# Patient Record
Sex: Female | Born: 1975 | Race: White | Hispanic: No | Marital: Married | Smoking: Current every day smoker
Health system: Southern US, Community
[De-identification: ages and names within clinical notes are randomized; demographics above are authoritative.]

## PROBLEM LIST (undated history)

## (undated) DIAGNOSIS — R51 Headache: Secondary | ICD-10-CM

## (undated) DIAGNOSIS — R519 Headache, unspecified: Secondary | ICD-10-CM

## (undated) DIAGNOSIS — S52121A Displaced fracture of head of right radius, initial encounter for closed fracture: Secondary | ICD-10-CM

## (undated) DIAGNOSIS — K219 Gastro-esophageal reflux disease without esophagitis: Secondary | ICD-10-CM

## (undated) HISTORY — PX: CHOLECYSTECTOMY: SHX55

## (undated) HISTORY — PX: TUBAL LIGATION: SHX77

---

## 2016-05-02 ENCOUNTER — Other Ambulatory Visit: Payer: Self-pay | Admitting: Orthopedic Surgery

## 2016-05-02 ENCOUNTER — Encounter (HOSPITAL_BASED_OUTPATIENT_CLINIC_OR_DEPARTMENT_OTHER): Payer: Self-pay | Admitting: *Deleted

## 2016-05-02 NOTE — H&P (Signed)
Kari Jenkins is an 41 y.o. female.   CC / Reason for Visit: Right elbow problem HPI: This patient is a 41 year old, right-hand-dominant, female who tripped and fell while at work and fractured her right radial head.  She was seen in the emergency department where she was placed into a posterior splint and sling.  They did not bring any x-rays with him today.  The patient reports that she has been taking some hydrocodone for pain but nothing else.  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  She arrives at this appointment with her nurse case manager, Debby Bud.  Past Medical History:  Diagnosis Date  . Fracture of radial head, right, closed   . GERD (gastroesophageal reflux disease)   . Headache     Past Surgical History:  Procedure Laterality Date  . CESAREAN SECTION    . CHOLECYSTECTOMY    . TUBAL LIGATION      History reviewed. No pertinent family history. Social History:  reports that she has been smoking.  She has been smoking about 0.50 packs per day. She has never used smokeless tobacco. She reports that she does not drink alcohol or use drugs.  Allergies: No Known Allergies  No prescriptions prior to admission.    No results found for this or any previous visit (from the past 48 hour(s)). No results found.  Review of Systems  All other systems reviewed and are negative.   Height  (1.626 m), weight 90.7 kg (200 lb), last menstrual period 04/12/2016. Physical Exam  Constitutional:  WD, WN, NAD HEENT:  NCAT, EOMI Neuro/Psych:  Alert & oriented to person, place, and time; appropriate mood & affect Lymphatic: No generalized UE edema or lymphadenopathy Extremities / MSK:  Both UE are normal with respect to appearance, ranges of motion, joint stability, muscle strength/tone, sensation, & perfusion except as otherwise noted:  The right elbow is swollen.  There is ecchymosis into the wrist on the volar  aspect.  The patient has full digital range of motion and is NVI.  At this time the patient is capable of extending her elbow to approximately 35 and flexing to 95.  She does have full supination and pronation.  Labs / Xrays:  2 views of the right elbow ordered and obtained today demonstrate a right, closed, intra-articular, radial head fracture with 3.8 mm of displacement of a portion of the head, roughly 30%  Assessment: Right elbow radial head fracture  Plan:  The findings are discussed with the nurse case manager as well as the patient.  She is to remain in a sling and utilize Tylenol for pain.  She is scheduled for ORIF of her right radial head for Monday 05/07/2016. The details of the operative procedure were discussed with the patient.  Questions were invited and answered.  In addition to the goal of the procedure, the risks of the procedure to include but not limited to bleeding; infection; damage to the nerves or blood vessels that could result in bleeding, numbness, weakness, chronic pain, and the need for additional procedures; stiffness; the need for revision surgery; and anesthetic risks were reviewed.  No specific outcome was guaranteed or implied.  Informed consent was obtained.   Aniyia Rane A., MD 05/02/2016, 5:37 PM

## 2016-05-07 ENCOUNTER — Encounter (HOSPITAL_BASED_OUTPATIENT_CLINIC_OR_DEPARTMENT_OTHER)
Admission: RE | Disposition: A | Discharge status: Home / Self Care | Payer: Self-pay | Source: Ambulatory Visit | Attending: Orthopedic Surgery

## 2016-05-07 ENCOUNTER — Ambulatory Visit (HOSPITAL_COMMUNITY): Payer: Worker's Compensation

## 2016-05-07 ENCOUNTER — Encounter (HOSPITAL_BASED_OUTPATIENT_CLINIC_OR_DEPARTMENT_OTHER): Payer: Self-pay | Admitting: *Deleted

## 2016-05-07 ENCOUNTER — Ambulatory Visit (HOSPITAL_BASED_OUTPATIENT_CLINIC_OR_DEPARTMENT_OTHER): Payer: Worker's Compensation | Admitting: Anesthesiology

## 2016-05-07 ENCOUNTER — Ambulatory Visit (HOSPITAL_BASED_OUTPATIENT_CLINIC_OR_DEPARTMENT_OTHER)
Admission: RE | Admit: 2016-05-07 | Discharge: 2016-05-07 | Disposition: A | Discharge status: Home / Self Care | Payer: Worker's Compensation | Source: Ambulatory Visit | Attending: Orthopedic Surgery | Admitting: Orthopedic Surgery

## 2016-05-07 DIAGNOSIS — Z419 Encounter for procedure for purposes other than remedying health state, unspecified: Secondary | ICD-10-CM

## 2016-05-07 DIAGNOSIS — Y990 Civilian activity done for income or pay: Secondary | ICD-10-CM | POA: Diagnosis not present

## 2016-05-07 DIAGNOSIS — F17210 Nicotine dependence, cigarettes, uncomplicated: Secondary | ICD-10-CM | POA: Diagnosis not present

## 2016-05-07 DIAGNOSIS — W010XXA Fall on same level from slipping, tripping and stumbling without subsequent striking against object, initial encounter: Secondary | ICD-10-CM | POA: Insufficient documentation

## 2016-05-07 DIAGNOSIS — S52121A Displaced fracture of head of right radius, initial encounter for closed fracture: Secondary | ICD-10-CM | POA: Diagnosis present

## 2016-05-07 HISTORY — DX: Headache, unspecified: R51.9

## 2016-05-07 HISTORY — DX: Headache: R51

## 2016-05-07 HISTORY — DX: Displaced fracture of head of right radius, initial encounter for closed fracture: S52.121A

## 2016-05-07 HISTORY — DX: Gastro-esophageal reflux disease without esophagitis: K21.9

## 2016-05-07 HISTORY — PX: ORIF RADIAL FRACTURE: SHX5113

## 2016-05-07 SURGERY — OPEN REDUCTION INTERNAL FIXATION (ORIF) RADIAL FRACTURE
Anesthesia: General | Site: Elbow | Laterality: Right

## 2016-05-07 MED ORDER — MIDAZOLAM HCL 2 MG/2ML IJ SOLN
INTRAMUSCULAR | Status: AC
  Filled 2016-05-07: qty 2

## 2016-05-07 MED ORDER — PROPOFOL 10 MG/ML IV BOLUS
INTRAVENOUS | Status: DC | PRN
  Administered 2016-05-07: 200 mg via INTRAVENOUS

## 2016-05-07 MED ORDER — PHENYLEPHRINE 40 MCG/ML (10ML) SYRINGE FOR IV PUSH (FOR BLOOD PRESSURE SUPPORT)
PREFILLED_SYRINGE | INTRAVENOUS | Status: AC
  Filled 2016-05-07: qty 10

## 2016-05-07 MED ORDER — FENTANYL CITRATE (PF) 100 MCG/2ML IJ SOLN: 25.0000 ug | INTRAMUSCULAR | Status: DC | PRN

## 2016-05-07 MED ORDER — EPHEDRINE 5 MG/ML INJ
INTRAVENOUS | Status: AC
  Filled 2016-05-07: qty 10

## 2016-05-07 MED ORDER — DEXAMETHASONE SODIUM PHOSPHATE 10 MG/ML IJ SOLN
INTRAMUSCULAR | Status: AC
  Filled 2016-05-07: qty 1

## 2016-05-07 MED ORDER — ACETAMINOPHEN 325 MG PO TABS: 650.0000 mg | ORAL_TABLET | Freq: Four times a day (QID) | ORAL | Status: AC | PRN

## 2016-05-07 MED ORDER — ONDANSETRON HCL 4 MG/2ML IJ SOLN
INTRAMUSCULAR | Status: DC | PRN
  Administered 2016-05-07: 4 mg via INTRAVENOUS

## 2016-05-07 MED ORDER — FENTANYL CITRATE (PF) 100 MCG/2ML IJ SOLN
INTRAMUSCULAR | Status: AC
  Filled 2016-05-07: qty 2

## 2016-05-07 MED ORDER — METOCLOPRAMIDE HCL 5 MG/ML IJ SOLN: 10.0000 mg | Freq: Once | INTRAMUSCULAR | Status: DC | PRN

## 2016-05-07 MED ORDER — MEPERIDINE HCL 25 MG/ML IJ SOLN: 6.2500 mg | INTRAMUSCULAR | Status: DC | PRN

## 2016-05-07 MED ORDER — ARTIFICIAL TEARS OP OINT
TOPICAL_OINTMENT | OPHTHALMIC | Status: AC
  Filled 2016-05-07: qty 3.5

## 2016-05-07 MED ORDER — ONDANSETRON HCL 4 MG/2ML IJ SOLN
INTRAMUSCULAR | Status: AC
  Filled 2016-05-07: qty 2

## 2016-05-07 MED ORDER — LIDOCAINE 2% (20 MG/ML) 5 ML SYRINGE
INTRAMUSCULAR | Status: AC
  Filled 2016-05-07: qty 5

## 2016-05-07 MED ORDER — FENTANYL CITRATE (PF) 100 MCG/2ML IJ SOLN
100.0000 ug | Freq: Once | INTRAMUSCULAR | Status: AC
  Administered 2016-05-07: 100 ug via INTRAVENOUS

## 2016-05-07 MED ORDER — BUPIVACAINE HCL (PF) 0.5 % IJ SOLN
INTRAMUSCULAR | Status: AC
  Filled 2016-05-07: qty 60

## 2016-05-07 MED ORDER — OXYCODONE HCL 5 MG PO TABS: 5.0000 mg | ORAL_TABLET | Freq: Four times a day (QID) | ORAL | 0 refills | Status: DC | PRN

## 2016-05-07 MED ORDER — MIDAZOLAM HCL 5 MG/5ML IJ SOLN
INTRAMUSCULAR | Status: DC | PRN
  Administered 2016-05-07: 1 mg via INTRAVENOUS

## 2016-05-07 MED ORDER — MIDAZOLAM HCL 2 MG/2ML IJ SOLN
2.0000 mg | Freq: Once | INTRAMUSCULAR | Status: AC
  Administered 2016-05-07: 2 mg via INTRAVENOUS

## 2016-05-07 MED ORDER — LACTATED RINGERS IV SOLN: INTRAVENOUS | Status: DC

## 2016-05-07 MED ORDER — PROPOFOL 500 MG/50ML IV EMUL
INTRAVENOUS | Status: AC
  Filled 2016-05-07: qty 50

## 2016-05-07 MED ORDER — LIDOCAINE HCL (PF) 1 % IJ SOLN
INTRAMUSCULAR | Status: AC
  Filled 2016-05-07: qty 60

## 2016-05-07 MED ORDER — SUCCINYLCHOLINE CHLORIDE 200 MG/10ML IV SOSY
PREFILLED_SYRINGE | INTRAVENOUS | Status: AC
  Filled 2016-05-07: qty 10

## 2016-05-07 MED ORDER — FENTANYL CITRATE (PF) 100 MCG/2ML IJ SOLN
INTRAMUSCULAR | Status: DC | PRN
  Administered 2016-05-07: 50 ug via INTRAVENOUS

## 2016-05-07 MED ORDER — LACTATED RINGERS IV SOLN
INTRAVENOUS | Status: DC
  Administered 2016-05-07 (×2): via INTRAVENOUS

## 2016-05-07 MED ORDER — DEXAMETHASONE SODIUM PHOSPHATE 10 MG/ML IJ SOLN
INTRAMUSCULAR | Status: DC | PRN
  Administered 2016-05-07: 10 mg via INTRAVENOUS

## 2016-05-07 MED ORDER — CEFAZOLIN SODIUM-DEXTROSE 2-4 GM/100ML-% IV SOLN
2.0000 g | INTRAVENOUS | Status: AC
  Administered 2016-05-07: 2 g via INTRAVENOUS

## 2016-05-07 MED ORDER — BUPIVACAINE-EPINEPHRINE (PF) 0.5% -1:200000 IJ SOLN
INTRAMUSCULAR | Status: DC | PRN
  Administered 2016-05-07: 30 mL via PERINEURAL

## 2016-05-07 SURGICAL SUPPLY — 69 items
BIT DRILL 1.5MMX85MM CALIB AO (BIT) ×1 IMPLANT
BIT DRILL 1.8 CANN MAX VPC (BIT) ×3 IMPLANT
BLADE MINI RND TIP GREEN BEAV (BLADE) IMPLANT
BLADE SURG 15 STRL LF DISP TIS (BLADE) ×1 IMPLANT
BLADE SURG 15 STRL SS (BLADE) ×2
BNDG COHESIVE 4X5 TAN STRL (GAUZE/BANDAGES/DRESSINGS) ×3 IMPLANT
BNDG ESMARK 4X9 LF (GAUZE/BANDAGES/DRESSINGS) ×3 IMPLANT
BNDG GAUZE ELAST 4 BULKY (GAUZE/BANDAGES/DRESSINGS) ×3 IMPLANT
BRUSH SCRUB EZ PLAIN DRY (MISCELLANEOUS) IMPLANT
CANISTER SUCT 1200ML W/VALVE (MISCELLANEOUS) IMPLANT
CHLORAPREP W/TINT 26ML (MISCELLANEOUS) ×3 IMPLANT
CORDS BIPOLAR (ELECTRODE) ×3 IMPLANT
COVER BACK TABLE 60X90IN (DRAPES) ×3 IMPLANT
COVER MAYO STAND STRL (DRAPES) ×3 IMPLANT
CUFF TOURNIQUET SINGLE 18IN (TOURNIQUET CUFF) IMPLANT
CUFF TOURNIQUET SINGLE 24IN (TOURNIQUET CUFF) IMPLANT
DRAPE C-ARM 42X72 X-RAY (DRAPES) ×3 IMPLANT
DRAPE EXTREMITY T 121X128X90 (DRAPE) ×3 IMPLANT
DRAPE SURG 17X23 STRL (DRAPES) ×3 IMPLANT
DRILL 1.5MMX85MM CALIB AO CONN (BIT) ×3
DRSG ADAPTIC 3X8 NADH LF (GAUZE/BANDAGES/DRESSINGS) IMPLANT
DRSG EMULSION OIL 3X3 NADH (GAUZE/BANDAGES/DRESSINGS) ×3 IMPLANT
ELECT REM PT RETURN 9FT ADLT (ELECTROSURGICAL)
ELECTRODE REM PT RTRN 9FT ADLT (ELECTROSURGICAL) IMPLANT
GAUZE SPONGE 4X4 12PLY STRL LF (GAUZE/BANDAGES/DRESSINGS) ×3 IMPLANT
GLOVE BIO SURGEON STRL SZ7.5 (GLOVE) ×3 IMPLANT
GLOVE BIOGEL PI IND STRL 7.0 (GLOVE) ×2 IMPLANT
GLOVE BIOGEL PI IND STRL 8 (GLOVE) ×2 IMPLANT
GLOVE BIOGEL PI INDICATOR 7.0 (GLOVE) ×4
GLOVE BIOGEL PI INDICATOR 8 (GLOVE) ×4
GLOVE ECLIPSE 6.5 STRL STRAW (GLOVE) ×6 IMPLANT
GOWN STRL REUS W/ TWL XL LVL3 (GOWN DISPOSABLE) ×2 IMPLANT
GOWN STRL REUS W/TWL XL LVL3 (GOWN DISPOSABLE) ×7 IMPLANT
K-WIRE COCR 0.9X95 (WIRE) ×3
K-WIRE TROCAR .045X4.72 (WIRE)
KWIRE COCR 0.9X95 (WIRE) ×1 IMPLANT
KWIRE TROCAR .045X4.72 (WIRE) IMPLANT
NEEDLE HYPO 25X1 1.5 SAFETY (NEEDLE) IMPLANT
NS IRRIG 1000ML POUR BTL (IV SOLUTION) ×3 IMPLANT
PACK BASIN DAY SURGERY FS (CUSTOM PROCEDURE TRAY) ×3 IMPLANT
PADDING CAST ABS 4INX4YD NS (CAST SUPPLIES)
PADDING CAST ABS COTTON 4X4 ST (CAST SUPPLIES) IMPLANT
PENCIL BUTTON HOLSTER BLD 10FT (ELECTRODE) IMPLANT
PLATE RADIAL HEAD EXPLOR RT (Plate) ×3 IMPLANT
SCREW 2.0X18MM NONLOCKING (Screw) ×6 IMPLANT
SCREW BONE LOCKING 2.0MMX14MM (Screw) ×3 IMPLANT
SCREW BONE NON-LOCK 2.0MMX16MM (Screw) ×3 IMPLANT
SCREW BONE NON-LOCK 2.0MMX20MM (Screw) ×3 IMPLANT
SCREW LOCKING 2.0X20MM (Screw) ×3 IMPLANT
SCREW VPC 2.5X16MM (Screw) ×3 IMPLANT
SLEEVE SCD COMPRESS KNEE MED (MISCELLANEOUS) ×3 IMPLANT
SLING ARM FOAM STRAP MED (SOFTGOODS) ×3 IMPLANT
SPONGE LAP 18X18 X RAY DECT (DISPOSABLE) IMPLANT
STOCKINETTE 6  STRL (DRAPES) ×2
STOCKINETTE 6 STRL (DRAPES) ×1 IMPLANT
SUCTION FRAZIER HANDLE 10FR (MISCELLANEOUS)
SUCTION TUBE FRAZIER 10FR DISP (MISCELLANEOUS) IMPLANT
SUT VIC AB 2-0 PS2 27 (SUTURE) ×6 IMPLANT
SUT VICRYL 4-0 PS2 18IN ABS (SUTURE) ×3 IMPLANT
SUT VICRYL RAPIDE 4-0 (SUTURE) IMPLANT
SUT VICRYL RAPIDE 4/0 PS 2 (SUTURE) ×3 IMPLANT
SYR 10ML LL (SYRINGE) IMPLANT
SYR BULB 3OZ (MISCELLANEOUS) ×3 IMPLANT
TOWEL OR 17X24 6PK STRL BLUE (TOWEL DISPOSABLE) ×6 IMPLANT
TOWEL OR NON WOVEN STRL DISP B (DISPOSABLE) ×3 IMPLANT
TUBE CONNECTING 20'X1/4 (TUBING)
TUBE CONNECTING 20X1/4 (TUBING) IMPLANT
UNDERPAD 30X30 (UNDERPADS AND DIAPERS) ×3 IMPLANT
YANKAUER SUCT BULB TIP NO VENT (SUCTIONS) IMPLANT

## 2016-05-07 NOTE — Progress Notes (Signed)
Assisted Dr. Carignan with right, ultrasound guided, supraclavicular block. Side rails up, monitors on throughout procedure. See vital signs in flow sheet. Tolerated Procedure well. 

## 2016-05-07 NOTE — Anesthesia Preprocedure Evaluation (Signed)
Anesthesia Evaluation  Patient identified by MRN, date of birth, ID band Patient awake    Reviewed: Allergy & Precautions, NPO status , Patient's Chart, lab work & pertinent test results  Airway Mallampati: II  TM Distance: >3 FB Neck ROM: Full    Dental no notable dental hx.    Pulmonary Current Smoker,    Pulmonary exam normal breath sounds clear to auscultation       Cardiovascular negative cardio ROS Normal cardiovascular exam Rhythm:Regular Rate:Normal     Neuro/Psych negative neurological ROS  negative psych ROS   GI/Hepatic negative GI ROS, Neg liver ROS,   Endo/Other  negative endocrine ROS  Renal/GU negative Renal ROS  negative genitourinary   Musculoskeletal negative musculoskeletal ROS (+)   Abdominal   Peds negative pediatric ROS (+)  Hematology negative hematology ROS (+)   Anesthesia Other Findings   Reproductive/Obstetrics negative OB ROS                            Anesthesia Physical Anesthesia Plan  ASA: II  Anesthesia Plan: General   Post-op Pain Management:  Regional for Post-op pain   Induction: Intravenous  Airway Management Planned: LMA  Additional Equipment:   Intra-op Plan:   Post-operative Plan:   Informed Consent: I have reviewed the patients History and Physical, chart, labs and discussed the procedure including the risks, benefits and alternatives for the proposed anesthesia with the patient or authorized representative who has indicated his/her understanding and acceptance.   Dental advisory given  Plan Discussed with: CRNA  Anesthesia Plan Comments:         Anesthesia Quick Evaluation

## 2016-05-07 NOTE — Discharge Instructions (Signed)
Discharge Instructions   You have a light dressing on your elbow.  You may begin gentle motion of your fingers and hand immediately as well as your elbow, but you should not do any heavy lifting or gripping.  Elevate your hand above your elbow to reduce pain & swelling of the digits.  Ice over the operative site or in the arm pit may be helpful to reduce pain & swelling.  DO NOT USE HEAT. Use meloxicam as stated on bottle. Add Tylenol OTC per bottle instructions every 6 hours. Utilize oxycodone for severe pain. Leave the dressing in place until the third day after your surgery and then remove it, leaving it open to air.  After the bandage has been removed you may shower, regularly washing the incision and letting the water run over it, but not submerging it (no swimming, soaking it in dishwater, etc.) You may drive a car when you are off of prescription pain medications and can safely control your vehicle with both hands. We will address whether therapy will be required or not when you return to the office. You may have already made your follow-up appointment when we completed your preop visit.  If not, please call our office today or the next business day to make your return appointment for 10-15 days after surgery.   Please call [phone] during normal business hours or [phone] after hours for any problems. Including the following:  - excessive redness of the incisions - drainage for more than 4 days - fever of more than 101.5 F  *Please note that pain medications will not be refilled after hours or on weekends.  Return to work on 05/09/2016 with no work with the right arm. May use sling for comfort.    Post Anesthesia Home Care Instructions  Activity: Get plenty of rest for the remainder of the day. A responsible individual must stay with you for 24 hours following the procedure.  For the next 24 hours, DO NOT: -Drive a car -Advertising copywriter -Drink alcoholic  beverages -Take any medication unless instructed by your physician -Make any legal decisions or sign important papers.  Meals: Start with liquid foods such as gelatin or soup. Progress to regular foods as tolerated. Avoid greasy, spicy, heavy foods. If nausea and/or vomiting occur, drink only clear liquids until the nausea and/or vomiting subsides. Call your physician if vomiting continues.  Special Instructions/Symptoms: Your throat may feel dry or sore from the anesthesia or the breathing tube placed in your throat during surgery. If this causes discomfort, gargle with warm salt water. The discomfort should disappear within 24 hours.  If you had a scopolamine patch placed behind your ear for the management of post- operative nausea and/or vomiting:  1. The medication in the patch is effective for 72 hours, after which it should be removed.  Wrap patch in a tissue and discard in the trash. Wash hands thoroughly with soap and water. 2. You may remove the patch earlier than 72 hours if you experience unpleasant side effects which may include dry mouth, dizziness or visual disturbances. 3. Avoid touching the patch. Wash your hands with soap and water after contact with the patch.      Regional Anesthesia Blocks  1. Numbness or the inability to move the "blocked" extremity may last from 3-48 hours after placement. The length of time depends on the medication injected and your individual response to the medication. If the numbness is not going away after 48 hours, call your surgeon.  2. The extremity that is blocked will need to be protected until the numbness is gone and the  Strength has returned. Because you cannot feel it, you will need to take extra care to avoid injury. Because it may be weak, you may have difficulty moving it or using it. You may not know what position it is in without looking at it while the block is in effect.  3. For blocks in the legs and feet, returning to weight  bearing and walking needs to be done carefully. You will need to wait until the numbness is entirely gone and the strength has returned. You should be able to move your leg and foot normally before you try and bear weight or walk. You will need someone to be with you when you first try to ensure you do not fall and possibly risk injury.  4. Bruising and tenderness at the needle site are common side effects and will resolve in a few days.  5. Persistent numbness or new problems with movement should be communicated to the surgeon or the [facility] [phone] [facility] [phone]).

## 2016-05-07 NOTE — Anesthesia Procedure Notes (Signed)
Anesthesia Regional Block: Supraclavicular block   Pre-Anesthetic Checklist: ,, timeout performed, Correct Patient, Correct Site, Correct Laterality, Correct Procedure, Correct Position, site marked, Risks and benefits discussed,  Surgical consent,  Pre-op evaluation,  At surgeon's request and post-op pain management  Laterality: Right and Upper  Prep: Maximum Sterile Barrier Precautions used, chloraprep       Needles:  Injection technique: Single-shot  Needle Type: Echogenic Stimulator Needle     Needle Length: 10cm      Additional Needles:   Procedures: ultrasound guided,,,,,,,,  Narrative:  Start time: 05/07/2016 6:59 AM End time: 05/07/2016 7:09 AM Injection made incrementally with aspirations every 5 mL.  Performed by: Personally  Anesthesiologist: Phillips Grout  Additional Notes: Risks, benefits and alternative to block explained extensively.  Patient tolerated procedure well, without complications.

## 2016-05-07 NOTE — Anesthesia Procedure Notes (Signed)
Procedure Name: LMA Insertion Date/Time: 05/07/2016 7:54 AM Performed by: Zenia Resides D Pre-anesthesia Checklist: Patient identified, Emergency Drugs available, Suction available and Patient being monitored Patient Re-evaluated:Patient Re-evaluated prior to inductionOxygen Delivery Method: Circle system utilized Preoxygenation: Pre-oxygenation with 100% oxygen Intubation Type: IV induction Ventilation: Mask ventilation without difficulty LMA: LMA inserted LMA Size: 4.0 Number of attempts: 1 Airway Equipment and Method: Bite block Placement Confirmation: positive ETCO2 Tube secured with: Tape Dental Injury: Teeth and Oropharynx as per pre-operative assessment

## 2016-05-07 NOTE — Op Note (Signed)
05/07/2016  7:45 AM  PATIENT:  Kari Jenkins  41 y.o. female  PRE-OPERATIVE DIAGNOSIS:  Displaced right radial head fracture  POST-OPERATIVE DIAGNOSIS:  Same, with chondral injury to the capitellum  PROCEDURE:  ORIF right radial head fracture  SURGEON: Cliffton Asters. Janee Morn, MD  PHYSICIAN ASSISTANT: Danielle Rankin, OPA-C  ANESTHESIA:  regional and general  SPECIMENS:  None  DRAINS:   None  EBL:  less than 50 mL  PREOPERATIVE INDICATIONS:  [name] is a  41 y.o. female with displaced comminuted right radial head fracture.  The risks benefits and alternatives were discussed with the patient preoperatively including but not limited to the risks of infection, bleeding, nerve injury, cardiopulmonary complications, the need for revision surgery, among others, and the patient verbalized understanding and consented to proceed.  OPERATIVE IMPLANTS: Biomet radial head rim plate/screws and headless compression screw  OPERATIVE PROCEDURE:  After receiving prophylactic antibiotics and a regional block, the patient was escorted to the operative theatre and placed in a supine position.  General anesthesia was administered.  A surgical "time-out" was performed during which the planned procedure, proposed operative site, and the correct patient identity were compared to the operative consent and agreement confirmed by the circulating nurse according to current facility policy.  Following application of a tourniquet to the operative extremity, the exposed skin was prepped with Chloraprep and draped in the usual sterile fashion.  The limb was exsanguinated with an Esmarch bandage and the tourniquet inflated to approximately higher than systolic BP.  Lateral approach was made, exploiting the interval between the [facility] and [facility].  The annular ligament was incised anterior of its midline and the flaps reflected.  There was a joint effusion which was evacuated.  The fracture was identified and  found to be comminuted, with 2 large fragments.  There was an interposed chondral piece, which was off of the capitellum, mostly on the lateral margin of the capitellum.  This was removed and discarded.  One of the radial head fragments was impacted head tilted, with 3-4 mm of displacement.  It was disimpacted back to anatomic alignment and with the rim plate lying in its appropriate position, secured with a couple of K wires from the set.  The plate was then sequentially drilled and filled with accommodation of locking and nonlocking screws.  The rim plate was laid on the nonarticular portion of the margin of the radial head.  This also required that the fragment which was actually more anterior and deep in the field be secured with an additional headless compression screw.  All the hardware was extra-articular and there was good fluid smooth pronation and supination.  The wound was irrigated, final fluoroscopic images obtained, and then the deep layer closed with 2-0 Vicryl suture with an attempt made not to close it to snugly.  The muscular interval was closed with the same stitch type.  Tourniquet was released some additional hemostasis obtained and then the skin was closed with 2-0 Vicryl deep dermal buried sutures followed by running 4-0 Vicryl Rapide subcuticular suture with benzoin and Steri-Strips.  A bulky dressing without plaster was applied and she was awakened and taken to the recovery room stable condition, breathing spontaneously.  DISPOSITION: She'll be discharged home today with typical postop instructions, returning in 10-15 days with new x-rays of the right elbow.  At her request, we will plan for her to return day after tomorrow on Wednesday,with nothing with the right hand greater and pencil/paper tasks.

## 2016-05-07 NOTE — Transfer of Care (Signed)
Immediate Anesthesia Transfer of Care Note  Patient: Kari Jenkins  Procedure(s) Performed: Procedure(s): OPEN TREATMENT OF RIGHT RADIAL HEAD FRACTURE (Right)  Patient Location: PACU  Anesthesia Type:General  Level of Consciousness: sedated  Airway & Oxygen Therapy: Patient Spontanous Breathing and Patient connected to face mask oxygen  Post-op Assessment: Report given to RN and Post -op Vital signs reviewed and stable  Post vital signs: Reviewed and stable  Last Vitals:  Vitals:   05/07/16 0715 05/07/16 0730  BP: 117/81 121/77  Pulse: 88 90  Resp: 19 (!) 22  Temp:      Last Pain:  Vitals:   05/07/16 0706  TempSrc:   PainSc: 0-No pain      Patients Stated Pain Goal: 3 (05/07/16 0655)  Complications: No apparent anesthesia complications

## 2016-05-07 NOTE — Interval H&P Note (Signed)
History and Physical Interval Note:  05/07/2016 7:44 AM  Kari Jenkins  has presented today for surgery, with the diagnosis of RIGHT RADIAL HEAD FRACTURE  The various methods of treatment have been discussed with the patient and family. After consideration of risks, benefits and other options for treatment, the patient has consented to  Procedure(s): OPEN TREATMENT OF RIGHT RADIAL HEAD FRACTURE (Right) as a surgical intervention .  The patient's history has been reviewed, patient examined, no change in status, stable for surgery.  I have reviewed the patient's chart and labs.  Questions were answered to the patient's satisfaction.     [name] A.

## 2016-05-07 NOTE — Anesthesia Postprocedure Evaluation (Signed)
Anesthesia Post Note  Patient: Kari Jenkins  Procedure(s) Performed: Procedure(s) (LRB): OPEN TREATMENT OF RIGHT RADIAL HEAD FRACTURE (Right)  Patient location during evaluation: PACU Anesthesia Type: General and Regional Level of consciousness: awake and alert Pain management: pain level controlled Vital Signs Assessment: post-procedure vital signs reviewed and stable Respiratory status: spontaneous breathing, nonlabored ventilation, respiratory function stable and patient connected to nasal cannula oxygen Cardiovascular status: blood pressure returned to baseline and stable Postop Assessment: no signs of nausea or vomiting Anesthetic complications: no       Last Vitals:  Vitals:   05/07/16 1000 05/07/16 1027  BP: 123/79 118/82  Pulse: 91 78  Resp: (!) 21 20  Temp:  36.8 C    Last Pain:  Vitals:   05/07/16 1027  TempSrc:   PainSc: 0-No pain                 Phillips Grout

## 2016-05-09 ENCOUNTER — Encounter (HOSPITAL_BASED_OUTPATIENT_CLINIC_OR_DEPARTMENT_OTHER): Payer: Self-pay | Admitting: Orthopedic Surgery

## 2017-04-25 ENCOUNTER — Other Ambulatory Visit: Payer: Self-pay | Admitting: Orthopedic Surgery

## 2017-05-02 ENCOUNTER — Encounter (HOSPITAL_BASED_OUTPATIENT_CLINIC_OR_DEPARTMENT_OTHER): Payer: Self-pay | Admitting: *Deleted

## 2017-05-02 ENCOUNTER — Other Ambulatory Visit: Payer: Self-pay

## 2017-05-09 NOTE — H&P (Signed)
Kari Jenkins is an 42 y.o. female.   CC / Reason for Visit: Right elbow pain HPI: This patient returns for reevaluation, indicating increased lateral elbow pain with soft tissue crepitus, that radiates distally.  She is ready to consider hardware removal.  HPI 12-12-16: This patient returns reevaluation, indicating that following the right elbow intra-articular steroid injection given on 11-13-16, all the tingling in her arm has resolved.  The lateral crepitus of the soft tissues remains, but without pain.  There is an occasional sharp very posterior pain near the tip of the olecranon that occurs with forced extension, but this is not present all the time  HPI 11-13-16: This patient returns for reevaluation, now just 2 weeks following last evaluation.  She reportedly did not take any of the oral steroids as prescribed as she "did not trust it".  She apparently has an irregular heartbeat and has concerns regarding the safety of steroids with this.  She is taking meloxicam.  Her pain is sporadic, but she also notes some tingling on the dorsum of the hand when carrying an object, but only when her arm is hand at her side.  This is re-created today, but when her elbow forearm wrist and hand position remains the same but her arm is brought from hanging at her side to straight out in front of her, the tingling is not present.  She reports that this tingling has only been present for about 1.5 months.  HPI 10-25-16: This patient returns to clinic today for reevaluation of her right elbow which has become increasingly painful in the last 3 weeks.  She indicates that she has not done any new activities and that there is no mechanism of injury for this discomfort.  She indicates that she has quite a bit of pain with wrist extension and digital extension.  She states that pain sometimes extends all the way down to her wrist.  She also reports that the crepitus that she feels in the elbow has now become painful with  supination and pronation.  She is here today along with her nurse case manager for reevaluation.  Past Medical History:  Diagnosis Date  . Fracture of radial head, right, closed   . GERD (gastroesophageal reflux disease)   . Headache     Past Surgical History:  Procedure Laterality Date  . CESAREAN SECTION    . CHOLECYSTECTOMY    . ORIF RADIAL FRACTURE Right 05/07/2016   Procedure: OPEN TREATMENT OF RIGHT RADIAL HEAD FRACTURE;  Surgeon: Mack Hook, MD;  Location: [facility];  Service: Orthopedics;  Laterality: Right;  . TUBAL LIGATION      History reviewed. No pertinent family history. Social History:  reports that she has been smoking.  She has been smoking about 0.50 packs per day. She has never used smokeless tobacco. She reports that she does not drink alcohol or use drugs.  Allergies:  Allergies  Allergen Reactions  . Coconut Oil Hives    No medications prior to admission.    No results found for this or any previous visit (from the past 48 hour(s)). No results found.  Review of Systems  All other systems reviewed and are negative.   Height 5\' 4"  (1.626 m), weight 89.4 kg (197 lb), last menstrual period 05/02/2017. Physical Exam  Constitutional:  WD, WN, NAD HEENT:  NCAT, EOMI Neuro/Psych:  Alert & oriented to person, place, and time; appropriate mood & affect Lymphatic: No generalized UE edema or lymphadenopathy Extremities / MSK:  Both UE are normal with respect to appearance, ranges of motion, joint stability, muscle strength/tone, sensation, & perfusion except as otherwise noted:  The elbow is not swollen.  Full flexion-extension of the elbow.  There is fairly exquisite tenderness with palpation of the radial head, worsened with pronation and supination, soft tissue crepitus still noted. Grip strength in position 2: Right 90/left 85 with the elbow flexed, 45/90 with it extended  Labs / Xrays:  3 views of the right elbow ordered and obtained  today demonstrate an intact rim plate and headless screw fixation of an anatomically aligned radial head.  There does not appear to be any lucency about the screws or the plate.  Assessment:  Right lateral elbow pain following ORIF right radial head fracture, likely related to soft-tissue irritation from the hardware  Plan: The findings are discussed with the patient as well as the nurse case manager.  I think that hardware removal would help her symptoms greatly, perhaps even full resolution.  I discussed with her that this would have a greater chance of helping lateral pain, not so much if she was still experiencing some medial pain.  She would like to proceed with hardware removal, and we will schedule it once [employer] authorization is received.  The details of the operative procedure were discussed with the patient.  Questions were invited and answered.  In addition to the goal of the procedure, the risks of the procedure to include but not limited to bleeding; infection; damage to the nerves or blood vessels that could result in bleeding, numbness, weakness, chronic pain, and the need for additional procedures; stiffness; the need for revision surgery; and anesthetic risks were reviewed.  No specific outcome was guaranteed or implied.  Informed consent was obtained.  Jodi Marbleavid A Arleen Bar, MD 05/09/2017, 10:15 AM

## 2017-05-13 ENCOUNTER — Ambulatory Visit (HOSPITAL_BASED_OUTPATIENT_CLINIC_OR_DEPARTMENT_OTHER)
Admission: RE | Admit: 2017-05-13 | Discharge: 2017-05-13 | Disposition: A | Discharge status: Home / Self Care | Payer: Worker's Compensation | Source: Ambulatory Visit | Attending: Orthopedic Surgery | Admitting: Orthopedic Surgery

## 2017-05-13 ENCOUNTER — Ambulatory Visit (HOSPITAL_COMMUNITY): Payer: Worker's Compensation

## 2017-05-13 ENCOUNTER — Ambulatory Visit (HOSPITAL_BASED_OUTPATIENT_CLINIC_OR_DEPARTMENT_OTHER): Payer: Worker's Compensation | Admitting: Certified Registered"

## 2017-05-13 ENCOUNTER — Encounter (HOSPITAL_BASED_OUTPATIENT_CLINIC_OR_DEPARTMENT_OTHER): Payer: Self-pay | Admitting: Certified Registered"

## 2017-05-13 ENCOUNTER — Encounter (HOSPITAL_BASED_OUTPATIENT_CLINIC_OR_DEPARTMENT_OTHER)
Admission: RE | Disposition: A | Discharge status: Home / Self Care | Payer: Self-pay | Source: Ambulatory Visit | Attending: Orthopedic Surgery

## 2017-05-13 ENCOUNTER — Other Ambulatory Visit: Payer: Self-pay

## 2017-05-13 DIAGNOSIS — Z9049 Acquired absence of other specified parts of digestive tract: Secondary | ICD-10-CM | POA: Diagnosis not present

## 2017-05-13 DIAGNOSIS — Z6834 Body mass index (BMI) 34.0-34.9, adult: Secondary | ICD-10-CM | POA: Insufficient documentation

## 2017-05-13 DIAGNOSIS — T8484XA Pain due to internal orthopedic prosthetic devices, implants and grafts, initial encounter: Secondary | ICD-10-CM | POA: Diagnosis not present

## 2017-05-13 DIAGNOSIS — R51 Headache: Secondary | ICD-10-CM | POA: Insufficient documentation

## 2017-05-13 DIAGNOSIS — F17200 Nicotine dependence, unspecified, uncomplicated: Secondary | ICD-10-CM | POA: Diagnosis not present

## 2017-05-13 DIAGNOSIS — Z9102 Food additives allergy status: Secondary | ICD-10-CM | POA: Diagnosis not present

## 2017-05-13 DIAGNOSIS — E669 Obesity, unspecified: Secondary | ICD-10-CM | POA: Diagnosis not present

## 2017-05-13 DIAGNOSIS — X58XXXA Exposure to other specified factors, initial encounter: Secondary | ICD-10-CM | POA: Diagnosis not present

## 2017-05-13 DIAGNOSIS — K219 Gastro-esophageal reflux disease without esophagitis: Secondary | ICD-10-CM | POA: Insufficient documentation

## 2017-05-13 DIAGNOSIS — Z4789 Encounter for other orthopedic aftercare: Secondary | ICD-10-CM

## 2017-05-13 DIAGNOSIS — Z472 Encounter for removal of internal fixation device: Secondary | ICD-10-CM | POA: Diagnosis present

## 2017-05-13 HISTORY — PX: HARDWARE REMOVAL: SHX979

## 2017-05-13 SURGERY — REMOVAL, HARDWARE
Anesthesia: General | Site: Elbow | Laterality: Right

## 2017-05-13 MED ORDER — FENTANYL CITRATE (PF) 100 MCG/2ML IJ SOLN
50.0000 ug | INTRAMUSCULAR | Status: DC | PRN
  Administered 2017-05-13: 100 ug via INTRAVENOUS

## 2017-05-13 MED ORDER — OXYCODONE HCL 5 MG PO TABS: 5.0000 mg | ORAL_TABLET | Freq: Once | ORAL | Status: DC | PRN

## 2017-05-13 MED ORDER — MEPERIDINE HCL 25 MG/ML IJ SOLN: 6.2500 mg | INTRAMUSCULAR | Status: DC | PRN

## 2017-05-13 MED ORDER — FENTANYL CITRATE (PF) 100 MCG/2ML IJ SOLN
INTRAMUSCULAR | Status: AC
  Filled 2017-05-13: qty 2

## 2017-05-13 MED ORDER — LIDOCAINE HCL (CARDIAC) 20 MG/ML IV SOLN
INTRAVENOUS | Status: DC | PRN
  Administered 2017-05-13: 30 mg via INTRAVENOUS

## 2017-05-13 MED ORDER — ONDANSETRON HCL 4 MG/2ML IJ SOLN
INTRAMUSCULAR | Status: DC | PRN
  Administered 2017-05-13: 4 mg via INTRAVENOUS

## 2017-05-13 MED ORDER — BUPIVACAINE-EPINEPHRINE (PF) 0.5% -1:200000 IJ SOLN
INTRAMUSCULAR | Status: AC
  Filled 2017-05-13: qty 30

## 2017-05-13 MED ORDER — LACTATED RINGERS IV SOLN
INTRAVENOUS | Status: DC
  Administered 2017-05-13 (×2): via INTRAVENOUS

## 2017-05-13 MED ORDER — OXYCODONE HCL 5 MG PO TABS: 5.0000 mg | ORAL_TABLET | Freq: Four times a day (QID) | ORAL | 0 refills | Status: AC | PRN

## 2017-05-13 MED ORDER — DEXAMETHASONE SODIUM PHOSPHATE 10 MG/ML IJ SOLN
INTRAMUSCULAR | Status: DC | PRN
  Administered 2017-05-13: 10 mg via INTRAVENOUS

## 2017-05-13 MED ORDER — SCOPOLAMINE 1 MG/3DAYS TD PT72: 1.0000 | MEDICATED_PATCH | Freq: Once | TRANSDERMAL | Status: DC | PRN

## 2017-05-13 MED ORDER — CEFAZOLIN SODIUM-DEXTROSE 2-4 GM/100ML-% IV SOLN
INTRAVENOUS | Status: AC
  Filled 2017-05-13: qty 100

## 2017-05-13 MED ORDER — MIDAZOLAM HCL 2 MG/2ML IJ SOLN
INTRAMUSCULAR | Status: AC
  Filled 2017-05-13: qty 2

## 2017-05-13 MED ORDER — BUPIVACAINE HCL (PF) 0.25 % IJ SOLN
INTRAMUSCULAR | Status: AC
  Filled 2017-05-13: qty 30

## 2017-05-13 MED ORDER — PROMETHAZINE HCL 25 MG/ML IJ SOLN: 6.2500 mg | INTRAMUSCULAR | Status: DC | PRN

## 2017-05-13 MED ORDER — LACTATED RINGERS IV SOLN: INTRAVENOUS | Status: DC

## 2017-05-13 MED ORDER — HYDROMORPHONE HCL 1 MG/ML IJ SOLN: 0.2500 mg | INTRAMUSCULAR | Status: DC | PRN

## 2017-05-13 MED ORDER — OXYCODONE HCL 5 MG/5ML PO SOLN: 5.0000 mg | Freq: Once | ORAL | Status: DC | PRN

## 2017-05-13 MED ORDER — PROPOFOL 10 MG/ML IV BOLUS
INTRAVENOUS | Status: DC | PRN
  Administered 2017-05-13: 150 mg via INTRAVENOUS

## 2017-05-13 MED ORDER — MIDAZOLAM HCL 2 MG/2ML IJ SOLN
1.0000 mg | INTRAMUSCULAR | Status: DC | PRN
  Administered 2017-05-13: 2 mg via INTRAVENOUS

## 2017-05-13 MED ORDER — ROPIVACAINE HCL 5 MG/ML IJ SOLN
INTRAMUSCULAR | Status: DC | PRN
  Administered 2017-05-13: 30 mL via PERINEURAL

## 2017-05-13 MED ORDER — ONDANSETRON HCL 4 MG PO TABS: 4.0000 mg | ORAL_TABLET | Freq: Three times a day (TID) | ORAL | 0 refills | Status: AC | PRN

## 2017-05-13 MED ORDER — CEFAZOLIN SODIUM-DEXTROSE 2-4 GM/100ML-% IV SOLN
2.0000 g | INTRAVENOUS | Status: AC
  Administered 2017-05-13: 2 g via INTRAVENOUS

## 2017-05-13 SURGICAL SUPPLY — 59 items
BANDAGE COBAN STERILE 2 (GAUZE/BANDAGES/DRESSINGS) IMPLANT
BANDAGE ESMARK 6X9 LF (GAUZE/BANDAGES/DRESSINGS) IMPLANT
BLADE MINI RND TIP GREEN BEAV (BLADE) IMPLANT
BLADE SURG 15 STRL LF DISP TIS (BLADE) ×4 IMPLANT
BLADE SURG 15 STRL SS (BLADE) ×4
BNDG COHESIVE 4X5 TAN STRL (GAUZE/BANDAGES/DRESSINGS) ×4 IMPLANT
BNDG ESMARK 4X9 LF (GAUZE/BANDAGES/DRESSINGS) IMPLANT
BNDG ESMARK 6X9 LF (GAUZE/BANDAGES/DRESSINGS)
BNDG GAUZE 1X2.1 STRL (MISCELLANEOUS) IMPLANT
BNDG GAUZE ELAST 4 BULKY (GAUZE/BANDAGES/DRESSINGS) ×4 IMPLANT
CHLORAPREP W/TINT 26ML (MISCELLANEOUS) ×4 IMPLANT
CORD BIPOLAR FORCEPS 12FT (ELECTRODE) IMPLANT
COVER BACK TABLE 60X90IN (DRAPES) ×4 IMPLANT
COVER MAYO STAND STRL (DRAPES) ×4 IMPLANT
CUFF TOURNIQUET SINGLE 18IN (TOURNIQUET CUFF) IMPLANT
CUFF TOURNIQUET SINGLE 34IN LL (TOURNIQUET CUFF) IMPLANT
DRAIN PENROSE 1/2X12 LTX STRL (WOUND CARE) IMPLANT
DRAPE C-ARM 42X72 X-RAY (DRAPES) IMPLANT
DRAPE EXTREMITY T 121X128X90 (DRAPE) ×4 IMPLANT
DRAPE OEC MINIVIEW 54X84 (DRAPES) IMPLANT
DRAPE SURG 17X23 STRL (DRAPES) ×4 IMPLANT
DRSG EMULSION OIL 3X3 NADH (GAUZE/BANDAGES/DRESSINGS) ×4 IMPLANT
DRSG PAD ABDOMINAL 8X10 ST (GAUZE/BANDAGES/DRESSINGS) IMPLANT
ELECT REM PT RETURN 9FT ADLT (ELECTROSURGICAL)
ELECTRODE REM PT RTRN 9FT ADLT (ELECTROSURGICAL) IMPLANT
GAUZE SPONGE 4X4 12PLY STRL LF (GAUZE/BANDAGES/DRESSINGS) ×4 IMPLANT
GLOVE BIO SURGEON STRL SZ7.5 (GLOVE) ×4 IMPLANT
GLOVE BIOGEL PI IND STRL 7.0 (GLOVE) ×4 IMPLANT
GLOVE BIOGEL PI IND STRL 8 (GLOVE) ×2 IMPLANT
GLOVE BIOGEL PI INDICATOR 7.0 (GLOVE) ×4
GLOVE BIOGEL PI INDICATOR 8 (GLOVE) ×2
GLOVE ECLIPSE 6.5 STRL STRAW (GLOVE) ×8 IMPLANT
GOWN STRL REUS W/ TWL LRG LVL3 (GOWN DISPOSABLE) ×4 IMPLANT
GOWN STRL REUS W/TWL LRG LVL3 (GOWN DISPOSABLE) ×4
GOWN STRL REUS W/TWL XL LVL3 (GOWN DISPOSABLE) ×4 IMPLANT
NEEDLE HYPO 25X1 1.5 SAFETY (NEEDLE) IMPLANT
NS IRRIG 1000ML POUR BTL (IV SOLUTION) ×4 IMPLANT
PACK BASIN DAY SURGERY FS (CUSTOM PROCEDURE TRAY) ×4 IMPLANT
PADDING CAST ABS 4INX4YD NS (CAST SUPPLIES)
PADDING CAST ABS COTTON 4X4 ST (CAST SUPPLIES) IMPLANT
PENCIL BUTTON HOLSTER BLD 10FT (ELECTRODE) IMPLANT
RUBBERBAND STERILE (MISCELLANEOUS) IMPLANT
STOCKINETTE 6  STRL (DRAPES) ×2
STOCKINETTE 6 STRL (DRAPES) ×2 IMPLANT
SUCTION FRAZIER HANDLE 10FR (MISCELLANEOUS)
SUCTION TUBE FRAZIER 10FR DISP (MISCELLANEOUS) IMPLANT
SUT ETHILON 3 0 PS 1 (SUTURE) IMPLANT
SUT VIC AB 2-0 PS2 27 (SUTURE) IMPLANT
SUT VICRYL RAPIDE 4-0 (SUTURE) IMPLANT
SUT VICRYL RAPIDE 4/0 PS 2 (SUTURE) IMPLANT
SWAB COLLECTION DEVICE MRSA (MISCELLANEOUS) IMPLANT
SWAB CULTURE ESWAB REG 1ML (MISCELLANEOUS) IMPLANT
SYR 10ML LL (SYRINGE) IMPLANT
SYR BULB 3OZ (MISCELLANEOUS) ×4 IMPLANT
TOWEL OR 17X24 6PK STRL BLUE (TOWEL DISPOSABLE) ×4 IMPLANT
TOWEL OR NON WOVEN STRL DISP B (DISPOSABLE) ×4 IMPLANT
TUBE CONNECTING 20'X1/4 (TUBING)
TUBE CONNECTING 20X1/4 (TUBING) IMPLANT
UNDERPAD 30X30 (UNDERPADS AND DIAPERS) ×4 IMPLANT

## 2017-05-13 NOTE — Anesthesia Procedure Notes (Signed)
Procedure Name: LMA Insertion Date/Time: 05/13/2017 8:17 AM Performed by: Mrytle Bento D, CRNA Pre-anesthesia Checklist: Patient identified, Emergency Drugs available, Suction available and Patient being monitored Patient Re-evaluated:Patient Re-evaluated prior to induction Oxygen Delivery Method: Circle system utilized Preoxygenation: Pre-oxygenation with 100% oxygen Induction Type: IV induction Ventilation: Mask ventilation without difficulty LMA: LMA inserted LMA Size: 4.0 Number of attempts: 1 Airway Equipment and Method: Bite block Placement Confirmation: positive ETCO2 Tube secured with: Tape Dental Injury: Teeth and Oropharynx as per pre-operative assessment        

## 2017-05-13 NOTE — Anesthesia Procedure Notes (Signed)
Anesthesia Regional Block: Supraclavicular block   Pre-Anesthetic Checklist: ,, timeout performed, Correct Patient, Correct Site, Correct Laterality, Correct Procedure, Correct Position, site marked, Risks and benefits discussed,  Surgical consent,  Pre-op evaluation,  At surgeon's request and post-op pain management  Laterality: Right  Prep: chloraprep       Needles:  Injection technique: Single-shot  Needle Type: Stimiplex     Needle Length: 9cm  Needle Gauge: 21     Additional Needles:   Procedures:,,,, ultrasound used (permanent image in chart),,,,  Narrative:  Start time: 05/13/2017 7:46 AM End time: 05/13/2017 7:51 AM Injection made incrementally with aspirations every 5 mL.  Performed by: Personally  Anesthesiologist: Lowella CurbMiller, Carles Florea Ray, MD

## 2017-05-13 NOTE — Anesthesia Procedure Notes (Signed)
Procedure Name: LMA Insertion Date/Time: 05/13/2017 8:17 AM Performed by: Sheryn BisonBlocker, Arnika Larzelere D, CRNA Pre-anesthesia Checklist: Patient identified, Emergency Drugs available, Suction available and Patient being monitored Patient Re-evaluated:Patient Re-evaluated prior to induction Oxygen Delivery Method: Circle system utilized Preoxygenation: Pre-oxygenation with 100% oxygen Induction Type: IV induction Ventilation: Mask ventilation without difficulty LMA: LMA inserted LMA Size: 4.0 Number of attempts: 1 Airway Equipment and Method: Bite block Placement Confirmation: positive ETCO2 Tube secured with: Tape Dental Injury: Teeth and Oropharynx as per pre-operative assessment

## 2017-05-13 NOTE — Anesthesia Postprocedure Evaluation (Signed)
Anesthesia Post Note  Patient: Kari Jenkins  Procedure(s) Performed: HARDWARE REMOVAL FROM RIGHT ELBOW (Right Elbow)     Patient location during evaluation: PACU Anesthesia Type: General Level of consciousness: awake and alert Pain management: pain level controlled Vital Signs Assessment: post-procedure vital signs reviewed and stable Respiratory status: spontaneous breathing, nonlabored ventilation and respiratory function stable Cardiovascular status: blood pressure returned to baseline and stable Postop Assessment: no apparent nausea or vomiting Anesthetic complications: no    Last Vitals:  Vitals:   05/13/17 0930 05/13/17 1007  BP: 130/88 135/90  Pulse:  78  Resp:  20  Temp:  36.7 C  SpO2:  97%    Last Pain:  Vitals:   05/13/17 1007  TempSrc:   PainSc: 0-No pain                 Kari Jenkins

## 2017-05-13 NOTE — Anesthesia Preprocedure Evaluation (Signed)
Anesthesia Evaluation  Patient identified by MRN, date of birth, ID band Patient awake    Reviewed: Allergy & Precautions, NPO status , Patient's Chart, lab work & pertinent test results  Airway Mallampati: II  TM Distance: >3 FB Neck ROM: Full    Dental no notable dental hx.    Pulmonary Current Smoker,    Pulmonary exam normal breath sounds clear to auscultation       Cardiovascular negative cardio ROS Normal cardiovascular exam Rhythm:Regular Rate:Normal     Neuro/Psych  Headaches, negative neurological ROS  negative psych ROS   GI/Hepatic negative GI ROS, Neg liver ROS, GERD  ,  Endo/Other  negative endocrine ROS  Renal/GU negative Renal ROS  negative genitourinary   Musculoskeletal negative musculoskeletal ROS (+)   Abdominal (+) + obese,   Peds negative pediatric ROS (+)  Hematology negative hematology ROS (+)   Anesthesia Other Findings   Reproductive/Obstetrics negative OB ROS                             Anesthesia Physical  Anesthesia Plan  ASA: II  Anesthesia Plan: General   Post-op Pain Management:  Regional for Post-op pain   Induction: Intravenous  PONV Risk Score and Plan: 2 and Ondansetron and Midazolam  Airway Management Planned: LMA  Additional Equipment:   Intra-op Plan:   Post-operative Plan:   Informed Consent: I have reviewed the patients History and Physical, chart, labs and discussed the procedure including the risks, benefits and alternatives for the proposed anesthesia with the patient or authorized representative who has indicated his/her understanding and acceptance.   Dental advisory given  Plan Discussed with: CRNA  Anesthesia Plan Comments:         Anesthesia Quick Evaluation

## 2017-05-13 NOTE — Interval H&P Note (Signed)
History and Physical Interval Note:  05/13/2017 8:05 AM  Kari BlazingNatalie Fells  has presented today for surgery, with the diagnosis of RIGHT ELBOW PAINFUL HARDWARE T85.848D  The various methods of treatment have been discussed with the patient and family. After consideration of risks, benefits and other options for treatment, the patient has consented to  Procedure(s): HARDWARE REMOVAL FROM RIGHT ELBOW (Right) as a surgical intervention .  The patient's history has been reviewed, patient examined, no change in status, stable for surgery.  I have reviewed the patient's chart and labs.  Questions were answered to the patient's satisfaction.     Jodi Marbleavid A Christie Copley

## 2017-05-13 NOTE — Op Note (Signed)
05/13/2017  8:06 AM  PATIENT:  Kari Jenkins  42 y.o. female  PRE-OPERATIVE DIAGNOSIS:  Painful orthopaedic fracture fixation devices, right proximal radius  POST-OPERATIVE DIAGNOSIS:  Same  PROCEDURE:  Removal of plate/screws from right proximal radius  SURGEON: Cliffton Astersavid A. Janee Mornhompson, MD  PHYSICIAN ASSISTANT: Danielle RankinKirsten Schrader, OPA-C  ANESTHESIA:  regional and general  SPECIMENS:  None  DRAINS:   None  EBL:  less than 50 mL  PREOPERATIVE INDICATIONS:  Kari Blazingatalie Jenkins is a  42 y.o. female with a h/o right proximal radius fx that underwent ORIF, now with painful crepitus over the implant and increasing pain, fracture is healed.  The risks benefits and alternatives were discussed with the patient preoperatively including but not limited to the risks of infection, bleeding, nerve injury, cardiopulmonary complications, the need for revision surgery, among others, and the patient verbalized understanding and consented to proceed.  OPERATIVE IMPLANTS: removed plate/screws  OPERATIVE PROCEDURE:  After receiving prophylactic antibiotics and a regional block, the patient was escorted to the operative theatre and placed in a supine position.  General anesthesia was administered.  A surgical "time-out" was performed during which the planned procedure, proposed operative site, and the correct patient identity were compared to the operative consent and agreement confirmed by the circulating nurse according to current facility policy.  Following application of a tourniquet to the operative extremity, the exposed skin was prepped with Chloraprep and draped in the usual sterile fashion.  The limb was exsanguinated with an Esmarch bandage and the tourniquet inflated to approximately 100mmHg higher than systolic BP.  The previous incision was marked and then carefully elliptically excised.  Subcutaneous tissues were dissected with blunt and spreading dissection.  The interval between the ECRL and EDC was  exploited sharply deeply down onto the radial capitellar joint, exposing it and then continuing distally as the forearm was held in pronation.  Fibers of the supinator were split with spreading dissection, exposing the hardware on the proximal aspect of the radius.  Sequentially the screws were removed, and then the plate.  The intramedullary headless compression screw was entirely within the bone, and was not protruding in any portion of the radial head.  Movement of the head was now smooth and fluid with no soft tissue crepitus about it.  The plate where the plate had Maurice MarchLane was further smoothed with a rongeur and some lateral synovitis was excised in the same fashion.  Final images were obtained fluoroscopically in the joint was irrigated.  There was some chondral degeneration at the radiocapitellar joint, constituting a grade I chondromalacia.  The deep approach was then reapproximated loosely with 2-0 Vicryl suture in an interrupted and running fashion.  The closure was 1 layer, encompassing both the deep and superficial layers with the same stitch.  Pronation and supination was still fluid and smooth.  The tourniquet was released, some additional hemostasis obtained, and the superficial layers again irrigated before closure.  The skin was reapproximated with 2-0 Vicryl deep dermal buried subcuticular sutures in an interrupted fashion, followed by running 4-0 Vicryl Rapide subcuticular suture with benzoin and Steri-Strips.  A soft long-arm dressing was applied and she was taken to the recovery room in stable condition, breathing spontaneously.  DISPOSITION: She will be discharged home today with typical instructions, returning in 10-15 days.

## 2017-05-13 NOTE — Transfer of Care (Signed)
Immediate Anesthesia Transfer of Care Note  Patient: Kari Jenkins  Procedure(s) Performed: HARDWARE REMOVAL FROM RIGHT ELBOW (Right Elbow)  Patient Location: PACU  Anesthesia Type:GA combined with regional for post-op pain  Level of Consciousness: awake and patient cooperative  Airway & Oxygen Therapy: Patient Spontanous Breathing and Patient connected to face mask oxygen  Post-op Assessment: Report given to RN and Post -op Vital signs reviewed and stable  Post vital signs: Reviewed and stable  Last Vitals:  Vitals Value Taken Time  BP    Temp    Pulse 81 05/13/2017  9:09 AM  Resp    SpO2 97 % 05/13/2017  9:09 AM  Vitals shown include unvalidated device data.  Last Pain:  Vitals:   05/13/17 0645  TempSrc: Oral  PainSc: 0-No pain         Complications: No apparent anesthesia complications

## 2017-05-13 NOTE — Discharge Instructions (Signed)
Post Anesthesia Home Care Instructions  Activity: Get plenty of rest for the remainder of the day. A responsible individual must stay with you for 24 hours following the procedure.  For the next 24 hours, DO NOT: -Drive a car -Advertising copywriter -Drink alcoholic beverages -Take any medication unless instructed by your physician -Make any legal decisions or sign important papers.  Meals: Start with liquid foods such as gelatin or soup. Progress to regular foods as tolerated. Avoid greasy, spicy, heavy foods. If nausea and/or vomiting occur, drink only clear liquids until the nausea and/or vomiting subsides. Call your physician if vomiting continues.  Special Instructions/Symptoms: Your throat may feel dry or sore from the anesthesia or the breathing tube placed in your throat during surgery. If this causes discomfort, gargle with warm salt water. The discomfort should disappear within 24 hours.  If you had a scopolamine patch placed behind your ear for the management of post- operative nausea and/or vomiting:  1. The medication in the patch is effective for 72 hours, after which it should be removed.  Wrap patch in a tissue and discard in the trash. Wash hands thoroughly with soap and water. 2. You may remove the patch earlier than 72 hours if you experience unpleasant side effects which may include dry mouth, dizziness or visual disturbances. 3. Avoid touching the patch. Wash your hands with soap and water after contact with the patch.     Regional Anesthesia Blocks  1. Numbness or the inability to move the "blocked" extremity may last from 3-48 hours after placement. The length of time depends on the medication injected and your individual response to the medication. If the numbness is not going away after 48 hours, call your surgeon.  2. The extremity that is blocked will need to be protected until the numbness is gone and the  Strength has returned. Because you cannot feel it, you  will need to take extra care to avoid injury. Because it may be weak, you may have difficulty moving it or using it. You may not know what position it is in without looking at it while the block is in effect.  3. For blocks in the legs and feet, returning to weight bearing and walking needs to be done carefully. You will need to wait until the numbness is entirely gone and the strength has returned. You should be able to move your leg and foot normally before you try and bear weight or walk. You will need someone to be with you when you first try to ensure you do not fall and possibly risk injury.  4. Bruising and tenderness at the needle site are common side effects and will resolve in a few days.  5. Persistent numbness or new problems with movement should be communicated to the surgeon or the [facility] [phone] [facility] [phone]).   Discharge Instructions   You have a light dressing on your hand.  You may begin gentle motion of your fingers and hand immediately, but you should not do any heavy lifting or gripping.  Elevate your hand to reduce pain & swelling of the digits.  Ice over the operative site may be helpful to reduce pain & swelling.  DO NOT USE HEAT. Pain medicine has been prescribed for you.  Take ibuprofen 600 mg and Tylenol 650 mg every 6 hours together. Take the Oxycodone additionally for severe pain. The Zofran is for nausea. Leave the dressing in place until the third day  after your surgery and then remove it, leaving it open to air.  After the bandage has been removed you may shower, regularly washing the incision and letting the water run over it, but not submerging it (no swimming, soaking it in dishwater, etc.) You may drive a car when you are off of prescription pain medications and can safely control your vehicle with both hands. We will address whether therapy will be required or not when you return to the office. You may have  already made your follow-up appointment when we completed your preop visit.  If not, please call our office today or the next business day to make your return appointment for 10-15 days after surgery.   Please call [phone] during normal business hours or [phone] after hours for any problems. Including the following:  - excessive redness of the incisions - drainage for more than 4 days - fever of more than 101.5 F  *Please note that pain medications will not be refilled after hours or on weekends.  Work status: The patient may return to work 05/14/2017 with no lifting, gripping and grasping of the right arm greater than paper and pencil tasks.

## 2017-05-13 NOTE — Progress Notes (Signed)
Assisted Dr. Miller with right, ultrasound guided, supraclavicular block. Side rails up, monitors on throughout procedure. See vital signs in flow sheet. Tolerated Procedure well. 

## 2017-05-14 ENCOUNTER — Encounter (HOSPITAL_BASED_OUTPATIENT_CLINIC_OR_DEPARTMENT_OTHER): Payer: Self-pay | Admitting: Orthopedic Surgery

## 2019-05-23 IMAGING — RF DG C-ARM 61-120 MIN
1 series · 1 of 1 positions shown · non-contrast
Comparison: 05/07/2016

CLINICAL DATA: Hardware removal

EXAM:
RIGHT ELBOW - COMPLETE 3+ VIEW

[Series 1: run · 1 of 1 slices shown]
[im 1/1]
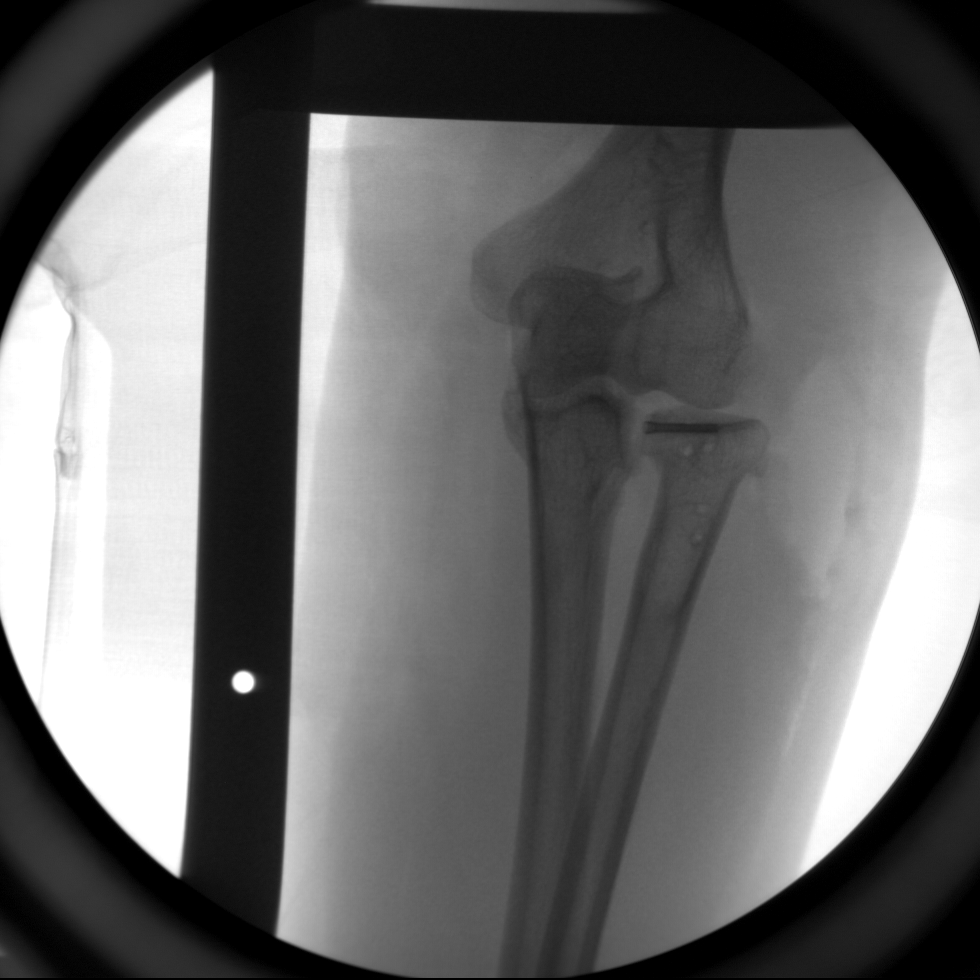

[1 of 1 positions shown; findings below may reference images not displayed]

FLUOROSCOPY TIME:  Radiation Exposure Index (as provided by the
fluoroscopic device): Not available

If the device does not provide the exposure index:

Fluoroscopy Time:  14 seconds

Number of Acquired Images:  1
FINDINGS: Previously seen fixation sideplate in the proximal radius has been
removed. A fixation screw within the radial head is stable.
IMPRESSION: Removal of proximal fixation hardware in the radius.
